# Patient Record
Sex: Female | Born: 1965 | Hispanic: No | Marital: Married | Smoking: Former smoker
Health system: Southern US, Community
[De-identification: ages and names within clinical notes are randomized; demographics above are authoritative.]

## PROBLEM LIST (undated history)

## (undated) DIAGNOSIS — J9819 Other pulmonary collapse: Secondary | ICD-10-CM

## (undated) DIAGNOSIS — S62109A Fracture of unspecified carpal bone, unspecified wrist, initial encounter for closed fracture: Secondary | ICD-10-CM

## (undated) DIAGNOSIS — S42009A Fracture of unspecified part of unspecified clavicle, initial encounter for closed fracture: Secondary | ICD-10-CM

## (undated) HISTORY — PX: EYE SURGERY: SHX253

## (undated) HISTORY — PX: TUBAL LIGATION: SHX77

## (undated) HISTORY — PX: ABDOMINAL HYSTERECTOMY: SHX81

## (undated) HISTORY — PX: KNEE ARTHROCENTESIS: SUR44

---

## 2007-05-15 ENCOUNTER — Ambulatory Visit (HOSPITAL_BASED_OUTPATIENT_CLINIC_OR_DEPARTMENT_OTHER): Admission: RE | Admit: 2007-05-15 | Discharge: 2007-05-15 | Payer: Self-pay | Admitting: Ophthalmology

## 2011-01-31 NOTE — Op Note (Signed)
NAMEKAILEIA, FLOW              ACCOUNT NO.:  [id number]   MEDICAL RECORD NO.:  [id number]          PATIENT TYPE:  AMB   LOCATION:  [facility]                         FACILITY:  [facility]   PHYSICIAN:  Tyrone Apple. Karleen Hampshire, M.D.DATE OF BIRTH:  03/01/1966   DATE OF PROCEDURE:  05/15/2007  DATE OF DISCHARGE:                               OPERATIVE REPORT   PREOPERATIVE DIAGNOSIS:  Consecutive exotropia with right hypertropia.   POSTOPERATIVE DIAGNOSIS:  Status post repair of strabismus.   PROCEDURE:  Right medial rectus resection of 6 mm, right lateral rectus  recession of 8 mm, right superior oblique tuck of 5 mm, left lateral  rectus recession of 8 mm on adjustable suture.   SURGEON:  Tyrone Apple. Karleen Hampshire, M.D.   ANESTHESIA:  General with laryngeal airway.   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  This procedure is indicated to restore alignment of the  visual axis and restore single and binocular vision.  The risks and  benefits of the procedure were explained to the patient prior to the  procedure and informed consent was obtained.   DESCRIPTION OF PROCEDURE:  The patient was taken into the operating room  and placed in a supine position.  The entire face was prepped and draped  in the usual sterile manner.  After the induction of general anesthesia  and establishment of laryngeal airway, my attention was first directed  to the right eye.  A lid speculum was placed.  Forced duction tests were  performed and found to be negative.  The globe was then held in the  superotemporal quadrant, the eye was depressed and adducted.  An  incision was made through the superotemporal fornix and taken down to  the posterior subtenon's space.  The right superior oblique tendon was  then isolated on two Stephens hooks.  It was subsequently dissected free  from its overlying muscle  fascia and intramuscular septum and it was  held on a Greene hook and a mark was placed on the tendon at the 5 mm.  The tendon was then imbricated with 6-0 Vicryl suture at the preplaced  mark and a 5 mm tendon tuck was then performed.  The sutures were tied  securely and the conjunctiva was repositioned.   My attention was then directed to the left eye and a lid speculum was  placed.  Forced duction tests were performed and found to be negative.  The globe was held in the inferotemporal quadrant, the eye was elevated  and adducted, an incision was made through the inferior temporal fornix  and taken down to the posterior subtenon's space.  The left lateral  rectus tendon was then isolated on a Stephens hook and subsequently on  the [employer].  The lateral rectus tendon had been previously operated  and was recessed approximately 3 mm from its native insertion.  It was  then imbricated on 6-0 Vicryl suture, detached from the globe, and  recessed an additional 5 mm completing an 8 mm recession.  The tendon  was then reattached to the globe with the preplaced sutures and the  sutures were tied securely.   My attention was then directed to the right lateral rectus recession  where an identical right rectus recession of 8 mm was performed using  the technique outlined above, with the exception that the sutures were  placed in ajustable suture fashion .The tendon was previously recessed  and it was necessary to go through scar tissue in all of the resection.   Next, my attention was directed to the right medial rectus tendon.  The  globe was held in the inferonasal quadrant, the eye was elevated and  abducted, an incision was made through the inferior nasal fornix and  taken down to the posterior subtenon's space.  The right medial rectus  tendon was then isolated on a Stephens hook and subsequently on the  [employer].  It was carefully dissected free from its overlying muscle  fascia  and recessed for a distance of approximately 8 mm, a mark was  then placed on the tendon at 6 mm from its insertion, and the tendon had  been previously recessed at approximately 4 mm.  The tendon was then  imbricated on a 6-0 Vicryl suture, taking two locking bites at the  medial and temporal apices.  It was then advanced to its native  insertion on the preplaced sutures after resection of 6 mm.  The sutures  were tied securely.   The conjunctiva was repositioned.  At the conclusion of the procedure,  TobraDex ointment was instilled in the fornices of the left eye and the  right lateral rectus recession which was placed on adjustable suture,  was placed underneath a double pressure patch for later adjustment.  The  patient was subsequently adjusted in the operating suite one hour post  the initial procedure.  There were no apparent complications for either  procedure.      Casimiro Needle A. Karleen Hampshire, M.D.  Electronically Signed     MAS/MEDQ  D:  05/15/2007  T:  05/15/2007  Job:  [id number]

## 2011-06-30 LAB — POCT HEMOGLOBIN-HEMACUE: Operator id: 268271

## 2015-04-07 ENCOUNTER — Encounter (HOSPITAL_COMMUNITY): Payer: Self-pay | Admitting: Emergency Medicine

## 2015-04-07 ENCOUNTER — Emergency Department (HOSPITAL_COMMUNITY)
Admission: EM | Admit: 2015-04-07 | Discharge: 2015-04-07 | Disposition: A | Discharge status: Home / Self Care | Payer: Self-pay | Attending: Emergency Medicine | Admitting: Emergency Medicine

## 2015-04-07 DIAGNOSIS — Z87891 Personal history of nicotine dependence: Secondary | ICD-10-CM | POA: Insufficient documentation

## 2015-04-07 DIAGNOSIS — Z79899 Other long term (current) drug therapy: Secondary | ICD-10-CM | POA: Insufficient documentation

## 2015-04-07 DIAGNOSIS — M25512 Pain in left shoulder: Secondary | ICD-10-CM | POA: Insufficient documentation

## 2015-04-07 DIAGNOSIS — M542 Cervicalgia: Secondary | ICD-10-CM | POA: Insufficient documentation

## 2015-04-07 DIAGNOSIS — A691 Other Vincent's infections: Secondary | ICD-10-CM | POA: Insufficient documentation

## 2015-04-07 DIAGNOSIS — M549 Dorsalgia, unspecified: Secondary | ICD-10-CM | POA: Insufficient documentation

## 2015-04-07 MED ORDER — CLINDAMYCIN HCL 150 MG PO CAPS: 450.0000 mg | ORAL_CAPSULE | Freq: Three times a day (TID) | ORAL | Status: AC

## 2015-04-07 MED ORDER — CLINDAMYCIN HCL 150 MG PO CAPS
450.0000 mg | ORAL_CAPSULE | Freq: Once | ORAL | Status: AC
  Administered 2015-04-07: 450 mg via ORAL
  Filled 2015-04-07: qty 3

## 2015-04-07 NOTE — ED Notes (Signed)
Pt sister pt's face is has been swelling over the last 4 days.
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
## 2015-04-07 NOTE — Discharge Instructions (Signed)
Gingivitis  Gingivitis is an infection of the teeth and bones that support the teeth. Your gums become red, sore, and puffy (swollen). It is caused by germs that build up on your teeth and gums (plaque). HOME CARE  Floss and then brush your teeth.  Brush at least twice a day.  Floss at least once a day.  Avoid sugar between meals.  Do not drink juice before bed. Only drink water.  Make and keep your regular checkups and cleanings with your dentist.  Use any mouth care product or toothpaste as told by your dentist. GET HELP RIGHT AWAY IF:  You have painful, red tissue around your teeth.  You have trouble chewing.  You have loose or infected teeth. MAKE SURE YOU:  Understand these instructions.  Will watch your condition.  Will get help right away if you are not doing well or get worse. Document Released: 10/07/2010 Document Revised: 11/27/2011 Document Reviewed: 12/09/2010 [facility] Patient Information 2015 [employer] [place]. This information is not intended to replace advice given to you by your health care provider. Make sure you discuss any questions you have with your health care provider.  Take anabolic as directed. The best to follow-up with a dentist sometime in the next few days. Return for any new or worse symptoms.

## 2015-07-07 ENCOUNTER — Ambulatory Visit (HOSPITAL_COMMUNITY): Payer: Self-pay | Admitting: Specialist

## 2015-07-13 ENCOUNTER — Ambulatory Visit (HOSPITAL_COMMUNITY): Payer: Self-pay | Attending: General Practice

## 2015-07-13 ENCOUNTER — Encounter (HOSPITAL_COMMUNITY): Payer: Self-pay

## 2015-07-13 DIAGNOSIS — M25532 Pain in left wrist: Secondary | ICD-10-CM | POA: Insufficient documentation

## 2015-07-13 DIAGNOSIS — M6281 Muscle weakness (generalized): Secondary | ICD-10-CM | POA: Insufficient documentation

## 2015-07-13 DIAGNOSIS — S52502S Unspecified fracture of the lower end of left radius, sequela: Secondary | ICD-10-CM | POA: Insufficient documentation

## 2015-07-13 DIAGNOSIS — R29898 Other symptoms and signs involving the musculoskeletal system: Secondary | ICD-10-CM

## 2015-07-13 DIAGNOSIS — M25632 Stiffness of left wrist, not elsewhere classified: Secondary | ICD-10-CM | POA: Insufficient documentation

## 2015-07-13 DIAGNOSIS — X58XXXS Exposure to other specified factors, sequela: Secondary | ICD-10-CM | POA: Insufficient documentation

## 2015-07-13 NOTE — Patient Instructions (Signed)
Complete each stretch 2-3X a day. Hold for 10 seconds. Repeat 3 times.   WRIST SUPINATION STRETCH  Grasp your wrist as shown and gently turn your affected wrist towards palm face up.   Keep your elbow bent and by the side of your  body.    WRIST FLEXOR STRETCH  Use your unaffected hand to bend the affected wrist up as shown.   Keep the elbow straight on the affected side the entire time.     WRIST EXTENSOR STRETCH  Use your unaffected hand to bend the affected wrist down as shown.   Keep the elbow straight on the affected side the entire time.    Home Exercises Program Theraputty Exercises  Do the following exercises 2-3 times a day using your affected hand. Spend about 15-30 minutes 1. Roll putty into a ball.  2. Make into a pancake.  3. Roll putty into a roll.  4. Pinch along log with first finger and thumb.   5. Make into a ball.  6. Roll it back into a log.   7. Pinch using thumb and side of first finger.  8. Roll into a ball, then flatten into a pancake.  9. Using your fingers, make putty into a mountain.
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
## 2015-07-21 ENCOUNTER — Telehealth (HOSPITAL_COMMUNITY): Payer: Self-pay | Admitting: Specialist

## 2015-07-21 ENCOUNTER — Ambulatory Visit (HOSPITAL_COMMUNITY): Payer: Self-pay | Admitting: Specialist

## 2015-07-21 NOTE — Telephone Encounter (Signed)
She was in pain and she forgot b/c she took a pain pill

## 2015-07-22 ENCOUNTER — Ambulatory Visit (HOSPITAL_COMMUNITY): Payer: Self-pay | Attending: General Practice | Admitting: Occupational Therapy

## 2015-07-22 ENCOUNTER — Encounter (HOSPITAL_COMMUNITY): Payer: Self-pay | Admitting: Occupational Therapy

## 2015-07-22 DIAGNOSIS — M25532 Pain in left wrist: Secondary | ICD-10-CM | POA: Insufficient documentation

## 2015-07-22 DIAGNOSIS — M6281 Muscle weakness (generalized): Secondary | ICD-10-CM | POA: Insufficient documentation

## 2015-07-22 DIAGNOSIS — M25632 Stiffness of left wrist, not elsewhere classified: Secondary | ICD-10-CM | POA: Insufficient documentation

## 2015-07-22 NOTE — Patient Instructions (Signed)
Edema Control (Control of Swelling)- General Guidelines for the Hand  1) Keep affected hand elevated above heart level as much as possible. 2) Perform retrograde massage- apply hand lotion to elevated hand & massage the lotion from the finger tips down to the wrist. massage down only, for about 5 minutes, 3-5x per day 3) Raise hand over head & open/close fist 20 x every hour. 4) Apply ice 10-15 minutes 3x per day (Or as needed)

## 2015-07-22 NOTE — Therapy (Signed)
[facility] [address] [place], [place], [address] Phone: [phone]   Fax:  [phone]  Occupational Therapy Treatment  Patient Details  Name: Penny ClarkSharon Cervantes MRN: [id number] Date of Birth: 04/19/1966 Referring Provider: Forest BeckerJason Halvorson  Encounter Date: 07/22/2015      OT End of Session - 07/22/15 1617    Visit Number 2   Number of Visits 12   Date for OT Re-Evaluation 09/11/15  Mini reassess: 08/10/15   Authorization Type Self Pay    OT Start Time 1516   OT Stop Time 1556   OT Time Calculation (min) 40 min   Activity Tolerance Patient tolerated treatment well   Behavior During Therapy [facility] for tasks assessed/performed      History reviewed. No pertinent past medical history.  Past Surgical History  Procedure Laterality Date  . Knee arthrocentesis    . Cesarean section    . Eye surgery    . Tubal ligation    . Abdominal hysterectomy      There were no vitals filed for this visit.  Visit Diagnosis:  Pain, wrist joint, left  Stiffness of wrist joint, left      Subjective Assessment - 07/22/15 1518    Subjective  S: It just started swelling today.    Currently in Pain? Yes   Pain Score 8    Pain Location Wrist   Pain Orientation Left   Pain Descriptors / Indicators Aching;Sore;Sharp   Pain Type Acute pain            [facility] OT Assessment - 07/22/15 1607    Assessment   Diagnosis Left distal radius fracture   Precautions   Precautions Other (comment)   Precaution Comments Work on wrist ROM as able to tolerate.                  OT Treatments/Exercises (OP) - 07/22/15 1608    Exercises   Exercises Wrist;Hand   Wrist Exercises   Forearm Supination PROM;10 reps;Self ROM;5 reps   Forearm Pronation PROM;10 reps;Self ROM;5 reps   Wrist Flexion PROM;10 reps;Self ROM;5 reps   Wrist Extension PROM;10 reps;Self ROM;5 reps   Wrist Radial Deviation PROM;10 reps   Wrist Ulnar Deviation PROM;10 reps   Other wrist exercises Reviewed HEP-wrist extension stretch, wrist flexion stretch, 3 reps 10 seconds each   Manual Therapy   Manual Therapy Edema management;Myofascial release   Edema Management Retrograde massage to left hand and distal wrist to decrease edema and pain and increase joint range of motion.   Myofascial Release Myofascial release to left dorsal and volar wrist and forearm regions to decrease pain and fascial restrictions and increase joint range of motion.                 OT Education - 07/22/15 1617    Education provided Yes   Education Details edema management strategies   Person(s) Educated Patient;Spouse   Methods Explanation;Demonstration;Handout   Comprehension Verbalized understanding;Returned demonstration          OT Short Term Goals - 07/22/15 1627    OT SHORT TERM GOAL #1   Title Patient will be educated and independent with HEP.   Time 3   Period Weeks   Status On-going   OT SHORT TERM GOAL #2   Title Patient will report a decrease in pain to 3/10 or less when completing daily tasks.   Time 3   Period Weeks   Status On-going   OT  SHORT TERM GOAL #3   Title Patient will increase A/ROM of wrist by 5 degrees to increase ability to fix hair.   Time 3   Period Weeks   Status On-going   OT SHORT TERM GOAL #4   Title Patient will increase grip strength by 10# and pinch strength by 4# to increase ability to hold onto items with dropping.   Time 3   Period Weeks   Status On-going   OT SHORT TERM GOAL #5   Title Patient will decrease fascial restrictions to trace amount to increase ability to manipulate buttons and zippers on clothing.    Time 3   Period Weeks   Status On-going           OT Long Term Goals - 07/22/15 1627    OT LONG TERM GOAL #1   Title Patient will return to highest level of independence with all daily tasks using LUE as dominant extremity.    Time 6   Period Weeks   Status On-going   OT LONG TERM GOAL #2   Title  Patient will increase A/ROM of left wrist and forearm to [facility] to increase ability to complete self care tasks.    Time 6   Period Weeks   Status On-going   OT LONG TERM GOAL #3   Title Patient will decrease pain level to 1/10 or less in order to return to handwriting tasks.   Time 6   Period Weeks   Status On-going   OT LONG TERM GOAL #4   Title Patient will increase grip strength by 15# and pinch strength by 6# to increase ability to hold onto items without dropping.    Time 6   Period Weeks   Status On-going   OT LONG TERM GOAL #5   Title Patient will increase left wrist and forearm trength to 4/5 to increase ability to return to driving tasks.     Time 6   Period Weeks   Status On-going               Plan - 07/22/15 1618    Clinical Impression Statement A: Pt called before appt to report increased swelling and discomfort. Pt arrived for appt with spouse, minimal edema noted along dorsal hand, lateral wrist area; pt reports she has been using it a lot at home. Initiated myofascial release, retrograde massage, P/ROM, and self-ROM wrist and forearm exercises. Provided pt with edema management strategies HEP.     Plan P: Continue working on achieving P/ROM WFL, follow up on edema management strategies. Attempt A/ROM exercises if able to tolerate        Problem List There are no active problems to display for this patient.   Ezra Sites, OTR/L  [phone]  07/22/2015, 4:28 PM  [facility] [address] [place], [place], [address] Phone: [phone]   Fax:  [phone]  Name: Penny Cervantes MRN: [id number] Date of Birth: 1966-04-17

## 2015-07-26 ENCOUNTER — Ambulatory Visit (HOSPITAL_COMMUNITY): Payer: Self-pay

## 2015-07-27 ENCOUNTER — Encounter (HOSPITAL_COMMUNITY): Payer: Self-pay | Admitting: Occupational Therapy

## 2015-07-27 ENCOUNTER — Ambulatory Visit (HOSPITAL_COMMUNITY): Payer: Self-pay | Admitting: Occupational Therapy

## 2015-07-27 DIAGNOSIS — M25532 Pain in left wrist: Secondary | ICD-10-CM

## 2015-07-27 DIAGNOSIS — M25632 Stiffness of left wrist, not elsewhere classified: Secondary | ICD-10-CM

## 2015-07-27 DIAGNOSIS — R29898 Other symptoms and signs involving the musculoskeletal system: Secondary | ICD-10-CM

## 2015-07-27 NOTE — Therapy (Signed)
[facility] [address] [place], [place], [address] Phone: [phone]   Fax:  [phone]  Occupational Therapy Treatment  Patient Details  Name: Penny Cervantes MRN: [id number] Date of Birth: [birth year] Referring Provider: Forest Becker  Encounter Date: 07/27/2015      OT End of Session - 07/27/15 1433    Visit Number 3   Number of Visits 12   Date for OT Re-Evaluation 09/11/15  Mini reassess: 08/10/15   Authorization Type Self Pay    OT Start Time 1347   OT Stop Time 1430   OT Time Calculation (min) 43 min   Activity Tolerance Patient tolerated treatment well   Behavior During Therapy [facility] for tasks assessed/performed      History reviewed. No pertinent past medical history.  Past Surgical History  Procedure Laterality Date  . Knee arthrocentesis    . Cesarean section    . Eye surgery    . Tubal ligation    . Abdominal hysterectomy      There were no vitals filed for this visit.  Visit Diagnosis:  Pain, wrist joint, left  Stiffness of wrist joint, left  Decreased grip strength of left hand      Subjective Assessment - 07/27/15 1347    Subjective  S: I fell on my arm yesterday and today.    Currently in Pain? Yes   Pain Score 8    Pain Location Shoulder   Pain Orientation Left   Pain Descriptors / Indicators Aching;Sore   Pain Type Acute pain            [facility] OT Assessment - 07/27/15 1347    Assessment   Diagnosis Left distal radius fracture   Precautions   Precautions Other (comment)   Precaution Comments Work on wrist ROM as able to tolerate.                  OT Treatments/Exercises (OP) - 07/27/15 1414    Exercises   Exercises Wrist;Hand   Wrist Exercises   Forearm Supination PROM;AROM;10 reps   Forearm Pronation PROM;AROM;10 reps   Wrist Flexion PROM;AROM;10 reps   Wrist Extension PROM;AROM;10 reps   Wrist Radial Deviation PROM;AROM;10 reps   Wrist Ulnar Deviation PROM;AROM;10  reps   Additional Wrist Exercises   Sponges 33 regular; 12 high resistance   Theraputty Flatten;Roll;Grip;Pinch   Theraputty - Flatten yellow   Theraputty - Roll yellow   Theraputty - Grip yellow-supinated and pronated    Theraputty - Pinch yellow-lateral and 2 pt   Manual Therapy   Manual Therapy Edema management;Myofascial release   Edema Management Retrograde massage to left hand and distal wrist to decrease edema and pain and increase joint range of motion.   Myofascial Release Myofascial release to left dorsal and volar wrist and forearm regions to decrease pain and fascial restrictions and increase joint range of motion.                   OT Short Term Goals - 07/22/15 1627    OT SHORT TERM GOAL #1   Title Patient will be educated and independent with HEP.   Time 3   Period Weeks   Status On-going   OT SHORT TERM GOAL #2   Title Patient will report a decrease in pain to 3/10 or less when completing daily tasks.   Time 3   Period Weeks   Status On-going   OT SHORT TERM GOAL #3   Title  Patient will increase A/ROM of wrist by 5 degrees to increase ability to fix hair.   Time 3   Period Weeks   Status On-going   OT SHORT TERM GOAL #4   Title Patient will increase grip strength by 10# and pinch strength by 4# to increase ability to hold onto items with dropping.   Time 3   Period Weeks   Status On-going   OT SHORT TERM GOAL #5   Title Patient will decrease fascial restrictions to trace amount to increase ability to manipulate buttons and zippers on clothing.    Time 3   Period Weeks   Status On-going           OT Long Term Goals - 07/22/15 1627    OT LONG TERM GOAL #1   Title Patient will return to highest level of independence with all daily tasks using LUE as dominant extremity.    Time 6   Period Weeks   Status On-going   OT LONG TERM GOAL #2   Title Patient will increase A/ROM of left wrist and forearm to [facility] to increase ability to complete self  care tasks.    Time 6   Period Weeks   Status On-going   OT LONG TERM GOAL #3   Title Patient will decrease pain level to 1/10 or less in order to return to handwriting tasks.   Time 6   Period Weeks   Status On-going   OT LONG TERM GOAL #4   Title Patient will increase grip strength by 15# and pinch strength by 6# to increase ability to hold onto items without dropping.    Time 6   Period Weeks   Status On-going   OT LONG TERM GOAL #5   Title Patient will increase left wrist and forearm trength to 4/5 to increase ability to return to driving tasks.     Time 6   Period Weeks   Status On-going               Plan - 07/27/15 1434    Clinical Impression Statement A: Pt presents with minimal edema along lateral wrist this session. Pt report she fell yesterday and today and caught herself on her hand/wrist, increased pain due to fall. Added A/ROM exercises, yellow theraputty, and sponges this session. Reviewed HEP with pt and husband.    Plan P: Continue working on achieving P/ROM [facility]. Continue P/ROM, A/ROM exercises, follow up on pain and swelling at home.         Problem List There are no active problems to display for this patient.   Ezra SitesLeslie Troxler, OTR/L  [phone]  07/27/2015, 2:40 PM   [facility] [address] [place], [place], [address] Phone: [phone]   Fax:  [phone]  Name: Penny Cervantes MRN: [id number] Date of Birth: 11/07/1965

## 2015-08-04 ENCOUNTER — Ambulatory Visit (HOSPITAL_COMMUNITY): Payer: Self-pay

## 2015-08-04 DIAGNOSIS — R29898 Other symptoms and signs involving the musculoskeletal system: Secondary | ICD-10-CM

## 2015-08-04 DIAGNOSIS — M25632 Stiffness of left wrist, not elsewhere classified: Secondary | ICD-10-CM

## 2015-08-04 DIAGNOSIS — M25532 Pain in left wrist: Secondary | ICD-10-CM

## 2015-08-04 NOTE — Patient Instructions (Addendum)
Use 1lb. Hand weight or can of soup or water bottle. Complete 12-15 reps. 2-3X a day.  Wrist Extension  Supporting the arm with a table top and palm facing downward, raise the weight as far as possible by bending the wrist back.  Maintain contact with the table throughout the movement.     FREE WEIGHT RADIAL DEVIATION - TABLE  Hold a small free weight, rest your forearm on a table and bend your wrist up and down with your palm facing towards the side as shown.     Wrist Supination  Supporting forearm on a table and with palm facing down, rotate the wrist to palm up position.  Maintain forearm contact with the table.

## 2015-08-04 NOTE — Therapy (Signed)
[facility] [address] [place], [place], [address] Phone: [phone]   Fax:  [phone]  Occupational Therapy Treatment  Patient Details  Name: Penny Cervantes MRN: [id number] Date of Birth: 1966-06-05 Referring Provider: Forest Becker  Encounter Date: 08/04/2015      OT End of Session - 08/04/15 1620    Visit Number 4   Number of Visits 12   Date for OT Re-Evaluation 09/11/15  Mini reassess: 08/10/15   Authorization Type Self Pay    OT Start Time 1520   OT Stop Time 1600   OT Time Calculation (min) 40 min   Activity Tolerance Patient tolerated treatment well   Behavior During Therapy [facility] for tasks assessed/performed      No past medical history on file.  Past Surgical History  Procedure Laterality Date  . Knee arthrocentesis    . Cesarean section    . Eye surgery    . Tubal ligation    . Abdominal hysterectomy      There were no vitals filed for this visit.  Visit Diagnosis:  Stiffness of wrist joint, left  Pain, wrist joint, left  Decreased grip strength of left hand      Subjective Assessment - 08/04/15 1617    Subjective  S: My nephew messed up my hand. I was changing his diaper and he was all over the place. He landed right on my wrist. It hurt really bad.    Patient is accompained by: Family member   Currently in Pain? Yes   Pain Score 1    Pain Location Wrist   Pain Orientation Left   Pain Descriptors / Indicators Sore   Pain Type Acute pain            [facility] OT Assessment - 08/04/15 1618    Assessment   Diagnosis Left distal radius fracture   Precautions   Precautions Other (comment)   Precaution Comments Work on wrist ROM as able to tolerate.   Restrictions   Weight Bearing Restrictions Yes   LUE Weight Bearing Non weight bearing   Other Position/Activity Restrictions NWB for 4 weeks (08/10/15)                  OT Treatments/Exercises (OP) - 08/04/15 0001    Exercises   Exercises Wrist;Hand   Weighted Stretch Over Towel Roll   Wrist Flexion - Weighted Stretch 1 pound;60 seconds   Wrist Extension - Weighted Stretch 1 pound;60 seconds   Wrist Exercises   Forearm Supination PROM;10 reps   Forearm Pronation PROM;10 reps   Wrist Flexion PROM;10 reps  Completed with elbow on table and gravity assisting.    Wrist Extension AROM;10 reps   Wrist Radial Deviation AROM;10 reps   Wrist Ulnar Deviation AROM;10 reps   Additional Wrist Exercises   Hand Gripper with Large Beads 6/6 beads with gripper set at 7# and 11#   Hand Gripper with Medium Beads 13/13 beads with gripper set at 7# and 11#   Hand Gripper with Small Beads 17/17 beads with gripper set at 7# and 11#   Manual Therapy   Manual Therapy Myofascial release   Manual therapy comments manual therapy completed prior to therapy exercises   Myofascial Release Myofascial release to left dorsal and volar wrist and forearm regions to decrease pain and fascial restrictions and increase joint range of motion.                 OT Education -  08/04/15 1616    Education provided Yes   Education Details Wrist strengthening exercises   Person(s) Educated Patient;Spouse   Methods Explanation;Demonstration;Handout   Comprehension Returned demonstration;Verbalized understanding          OT Short Term Goals - 07/22/15 1627    OT SHORT TERM GOAL #1   Title Patient will be educated and independent with HEP.   Time 3   Period Weeks   Status On-going   OT SHORT TERM GOAL #2   Title Patient will report a decrease in pain to 3/10 or less when completing daily tasks.   Time 3   Period Weeks   Status On-going   OT SHORT TERM GOAL #3   Title Patient will increase A/ROM of wrist by 5 degrees to increase ability to fix hair.   Time 3   Period Weeks   Status On-going   OT SHORT TERM GOAL #4   Title Patient will increase grip strength by 10# and pinch strength by 4# to increase ability to hold onto items with  dropping.   Time 3   Period Weeks   Status On-going   OT SHORT TERM GOAL #5   Title Patient will decrease fascial restrictions to trace amount to increase ability to manipulate buttons and zippers on clothing.    Time 3   Period Weeks   Status On-going           OT Long Term Goals - 07/22/15 1627    OT LONG TERM GOAL #1   Title Patient will return to highest level of independence with all daily tasks using LUE as dominant extremity.    Time 6   Period Weeks   Status On-going   OT LONG TERM GOAL #2   Title Patient will increase A/ROM of left wrist and forearm to [facility] to increase ability to complete self care tasks.    Time 6   Period Weeks   Status On-going   OT LONG TERM GOAL #3   Title Patient will decrease pain level to 1/10 or less in order to return to handwriting tasks.   Time 6   Period Weeks   Status On-going   OT LONG TERM GOAL #4   Title Patient will increase grip strength by 15# and pinch strength by 6# to increase ability to hold onto items without dropping.    Time 6   Period Weeks   Status On-going   OT LONG TERM GOAL #5   Title Patient will increase left wrist and forearm trength to 4/5 to increase ability to return to driving tasks.     Time 6   Period Weeks   Status On-going               Plan - 08/04/15 1620    Clinical Impression Statement A: Pt progressed to strengthening exercises this date. Pain reported during wrist flexion. Exercise was modified to be completed with gravity versus against gravity for increased comfort. Min VC for form and technique.   Plan P: Continue to work on strengthening of wrist and grip strengthening. Add fine motor task with tweezers and grooved pegboard.         Problem List There are no active problems to display for this patient.   Penny Cervantes, OTR/L,CBIS  [phone]  08/04/2015, 4:30 PM  [facility] [address] [place], [place], [address] Phone:  [phone]   Fax:  [phone]  Name: Penny ClarkSharon Cervantes MRN: [id number]  Date of Birth: 1965-11-03

## 2015-08-05 ENCOUNTER — Encounter (HOSPITAL_COMMUNITY): Payer: Self-pay | Admitting: Occupational Therapy

## 2015-08-05 ENCOUNTER — Ambulatory Visit (HOSPITAL_COMMUNITY): Payer: Self-pay | Admitting: Occupational Therapy

## 2015-08-05 DIAGNOSIS — R29898 Other symptoms and signs involving the musculoskeletal system: Secondary | ICD-10-CM

## 2015-08-05 DIAGNOSIS — M25532 Pain in left wrist: Secondary | ICD-10-CM

## 2015-08-05 DIAGNOSIS — M25632 Stiffness of left wrist, not elsewhere classified: Secondary | ICD-10-CM

## 2015-08-05 NOTE — Therapy (Signed)
[place] [facility] [address] , [place], [address] Phone: [phone]   Fax:  [phone]  Occupational Therapy Treatment  Patient Details  Name: Penny ClarkSharon Cervantes MRN: [id number] Date of Birth: 04/13/1966 Referring Provider: Forest BeckerJason Halvorson  Encounter Date: 08/05/2015      OT End of Session - 08/05/15 1601    Visit Number 5   Number of Visits 12   Date for OT Re-Evaluation 09/11/15  Mini reassess: 08/10/15   Authorization Type Self Pay    OT Start Time 1430   OT Stop Time 1515   OT Time Calculation (min) 45 min   Activity Tolerance Patient tolerated treatment well   Behavior During Therapy [facility] for tasks assessed/performed      History reviewed. No pertinent past medical history.  Past Surgical History  Procedure Laterality Date  . Knee arthrocentesis    . Cesarean section    . Eye surgery    . Tubal ligation    . Abdominal hysterectomy      There were no vitals filed for this visit.  Visit Diagnosis:  Stiffness of wrist joint, left  Pain, wrist joint, left  Decreased grip strength of left hand      Subjective Assessment - 08/05/15 1426    Subjective  S: My wrist is feeling pretty good, a little pain but not too much.    Currently in Pain? Yes   Pain Score 3    Pain Location Shoulder   Pain Orientation Left   Pain Descriptors / Indicators Sore   Pain Type Acute pain            [facility] OT Assessment - 08/05/15 1426    Assessment   Diagnosis Left distal radius fracture   Precautions   Precautions Other (comment)   Precaution Comments Work on wrist ROM as able to tolerate.                  OT Treatments/Exercises (OP) - 08/05/15 1431    Exercises   Exercises Wrist;Hand   Weighted Stretch Over Towel Roll   Wrist Flexion - Weighted Stretch 1 pound;60 seconds   Wrist Extension - Weighted Stretch 1 pound;60 seconds   Wrist Exercises   Forearm Supination PROM;10 reps   Forearm Pronation PROM;10  reps   Wrist Flexion PROM;AROM;10 reps   Wrist Extension PROM;AROM;10 reps   Wrist Radial Deviation AROM;10 reps   Wrist Ulnar Deviation AROM;10 reps   Additional Wrist Exercises   Theraputty Flatten;Roll;Grip;Pinch   Theraputty - Roll yellow   Theraputty - Grip yellow-supinated and pronated    Theraputty - Pinch yellow-lateral and 2 pt   Hand Gripper with Large Beads 6/6 beads with gripper set at 15#   Hand Gripper with Medium Beads 13/13 beads with gripper set 15#   Hand Gripper with Small Beads 17/17 beads with gripper set 15#   Fine Motor Coordination   Fine Motor Coordination Grooved pegs   Grooved pegs Pt completed grooved pegboard task with tweezers this session. Pt used left hand to grasp and place grooved pegs using tweezers, with mod difficulty turning and manipulating pegs into pegboard. Pt used right hand to position pegs in tweezers intermittantly during task.    Manual Therapy   Manual Therapy Myofascial release   Manual therapy comments manual therapy completed prior to therapy exercises   Myofascial Release Myofascial release to left dorsal and volar wrist and forearm regions to decrease pain and fascial restrictions and increase joint range  of motion.                 OT Education - 08/04/15 1616    Education provided Yes   Education Details Wrist strengthening exercises   Person(s) Educated Patient;Spouse   Methods Explanation;Demonstration;Handout   Comprehension Returned demonstration;Verbalized understanding          OT Short Term Goals - 07/22/15 1627    OT SHORT TERM GOAL #1   Title Patient will be educated and independent with HEP.   Time 3   Period Weeks   Status On-going   OT SHORT TERM GOAL #2   Title Patient will report a decrease in pain to 3/10 or less when completing daily tasks.   Time 3   Period Weeks   Status On-going   OT SHORT TERM GOAL #3   Title Patient will increase A/ROM of wrist by 5 degrees to increase ability to fix hair.    Time 3   Period Weeks   Status On-going   OT SHORT TERM GOAL #4   Title Patient will increase grip strength by 10# and pinch strength by 4# to increase ability to hold onto items with dropping.   Time 3   Period Weeks   Status On-going   OT SHORT TERM GOAL #5   Title Patient will decrease fascial restrictions to trace amount to increase ability to manipulate buttons and zippers on clothing.    Time 3   Period Weeks   Status On-going           OT Long Term Goals - 07/22/15 1627    OT LONG TERM GOAL #1   Title Patient will return to highest level of independence with all daily tasks using LUE as dominant extremity.    Time 6   Period Weeks   Status On-going   OT LONG TERM GOAL #2   Title Patient will increase A/ROM of left wrist and forearm to [facility] to increase ability to complete self care tasks.    Time 6   Period Weeks   Status On-going   OT LONG TERM GOAL #3   Title Patient will decrease pain level to 1/10 or less in order to return to handwriting tasks.   Time 6   Period Weeks   Status On-going   OT LONG TERM GOAL #4   Title Patient will increase grip strength by 15# and pinch strength by 6# to increase ability to hold onto items without dropping.    Time 6   Period Weeks   Status On-going   OT LONG TERM GOAL #5   Title Patient will increase left wrist and forearm trength to 4/5 to increase ability to return to driving tasks.     Time 6   Period Weeks   Status On-going               Plan - 08/05/15 1601    Clinical Impression Statement A: Added fine motor task using grooved pegboard today, increased hand gripper to 15#. Pt with minimal difficulty during hand gripper exercise, when questioned reports it is slightly challenging. Pt required extra time for tweezer pegboard task due to difficulty manipulating tweezers to place pegs in holes.    Plan P: Increase hand gripper to 18# or 22# as pt is able to tolerate.         Problem List There are no  active problems to display for this patient.   Ezra Sites, OTR/L  [phone]  08/05/2015,  4:05 PM  [facility] [address] [place], [place], [address] Phone: [phone]   Fax:  [phone]  Name: Penny Cervantes MRN: [id number] Date of Birth: 10/05/1965

## 2015-08-07 ENCOUNTER — Encounter (HOSPITAL_COMMUNITY): Payer: Self-pay | Admitting: Emergency Medicine

## 2015-08-07 ENCOUNTER — Emergency Department (HOSPITAL_COMMUNITY): Payer: Self-pay

## 2015-08-07 ENCOUNTER — Emergency Department (HOSPITAL_COMMUNITY)
Admission: EM | Admit: 2015-08-07 | Discharge: 2015-08-07 | Disposition: A | Discharge status: Home / Self Care | Payer: Self-pay | Attending: Emergency Medicine | Admitting: Emergency Medicine

## 2015-08-07 DIAGNOSIS — S40012A Contusion of left shoulder, initial encounter: Secondary | ICD-10-CM | POA: Insufficient documentation

## 2015-08-07 DIAGNOSIS — Z87891 Personal history of nicotine dependence: Secondary | ICD-10-CM | POA: Insufficient documentation

## 2015-08-07 DIAGNOSIS — S6992XA Unspecified injury of left wrist, hand and finger(s), initial encounter: Secondary | ICD-10-CM | POA: Insufficient documentation

## 2015-08-07 DIAGNOSIS — Y9289 Other specified places as the place of occurrence of the external cause: Secondary | ICD-10-CM | POA: Insufficient documentation

## 2015-08-07 DIAGNOSIS — S40021A Contusion of right upper arm, initial encounter: Secondary | ICD-10-CM | POA: Insufficient documentation

## 2015-08-07 DIAGNOSIS — S40022A Contusion of left upper arm, initial encounter: Secondary | ICD-10-CM

## 2015-08-07 DIAGNOSIS — Z79899 Other long term (current) drug therapy: Secondary | ICD-10-CM | POA: Insufficient documentation

## 2015-08-07 DIAGNOSIS — Z880 Allergy status to penicillin: Secondary | ICD-10-CM | POA: Insufficient documentation

## 2015-08-07 DIAGNOSIS — Y9389 Activity, other specified: Secondary | ICD-10-CM | POA: Insufficient documentation

## 2015-08-07 DIAGNOSIS — Z8709 Personal history of other diseases of the respiratory system: Secondary | ICD-10-CM | POA: Insufficient documentation

## 2015-08-07 DIAGNOSIS — Y998 Other external cause status: Secondary | ICD-10-CM | POA: Insufficient documentation

## 2015-08-07 DIAGNOSIS — Z8781 Personal history of (healed) traumatic fracture: Secondary | ICD-10-CM | POA: Insufficient documentation

## 2015-08-07 DIAGNOSIS — W01198A Fall on same level from slipping, tripping and stumbling with subsequent striking against other object, initial encounter: Secondary | ICD-10-CM | POA: Insufficient documentation

## 2015-08-07 DIAGNOSIS — S59912A Unspecified injury of left forearm, initial encounter: Secondary | ICD-10-CM | POA: Insufficient documentation

## 2015-08-07 HISTORY — DX: Other pulmonary collapse: J98.19

## 2015-08-07 HISTORY — DX: Fracture of unspecified carpal bone, unspecified wrist, initial encounter for closed fracture: S62.109A

## 2015-08-07 HISTORY — DX: Fracture of unspecified part of unspecified clavicle, initial encounter for closed fracture: S42.009A

## 2015-08-07 MED ORDER — HYDROCODONE-ACETAMINOPHEN 5-325 MG PO TABS
1.0000 | ORAL_TABLET | Freq: Once | ORAL | Status: AC
  Administered 2015-08-07: 1 via ORAL
  Filled 2015-08-07: qty 1

## 2015-08-07 MED ORDER — CYCLOBENZAPRINE HCL 10 MG PO TABS: 10.0000 mg | ORAL_TABLET | Freq: Two times a day (BID) | ORAL | Status: AC | PRN
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
## 2015-08-07 NOTE — Discharge Instructions (Signed)

## 2015-08-07 NOTE — ED Notes (Signed)
Pt c/o of pain to left shoulder after falling. Pt able to move extremity. No deformity noted.

## 2015-08-09 ENCOUNTER — Encounter (HOSPITAL_COMMUNITY): Payer: Self-pay

## 2015-08-09 ENCOUNTER — Ambulatory Visit (HOSPITAL_COMMUNITY): Payer: Self-pay

## 2015-08-09 DIAGNOSIS — R29898 Other symptoms and signs involving the musculoskeletal system: Secondary | ICD-10-CM

## 2015-08-09 DIAGNOSIS — M25632 Stiffness of left wrist, not elsewhere classified: Secondary | ICD-10-CM

## 2015-08-09 NOTE — Therapy (Signed)
[facility] [address] [place], [place], [address] Phone: [phone]   Fax:  [phone]  Occupational Therapy Treatment  Patient Details  Name: Penny Cervantes MRN: [id number] Date of Birth: 08-17-1966 Referring Provider: Forest Becker  Encounter Date: 08/09/2015      OT End of Session - 08/09/15 1613    Visit Number 6   Number of Visits 12   Date for OT Re-Evaluation 09/11/15  Mini reassess: 08/10/15   Authorization Type Self Pay    OT Start Time 1515   OT Stop Time 1600   OT Time Calculation (min) 45 min   Activity Tolerance Patient tolerated treatment well   Behavior During Therapy [facility] for tasks assessed/performed      Past Medical History  Diagnosis Date  . Collar bone fracture   . Broken wrist   . Collapsed lung     Past Surgical History  Procedure Laterality Date  . Knee arthrocentesis    . Cesarean section    . Eye surgery    . Tubal ligation    . Abdominal hysterectomy      There were no vitals filed for this visit.  Visit Diagnosis:  Stiffness of wrist joint, left  Decreased grip strength of left hand      Subjective Assessment - 08/09/15 1552    Subjective  S: I feel over my dog and I landed on my shoulder.    Currently in Pain? No/denies            [facility] OT Assessment - 08/09/15 1536    Assessment   Diagnosis Left distal radius fracture   Precautions   Precautions Other (comment)   Precaution Comments Work on wrist ROM as able to tolerate.   Restrictions   Weight Bearing Restrictions No                  OT Treatments/Exercises (OP) - 08/09/15 1554    Exercises   Exercises Wrist;Hand   Wrist Exercises   Forearm Supination PROM;10 reps   Forearm Pronation PROM;10 reps   Wrist Flexion PROM;AROM;10 reps   Wrist Extension PROM;AROM;10 reps   Wrist Radial Deviation AROM;PROM;10 reps   Wrist Ulnar Deviation AROM;PROM;10 reps   Other wrist exercises Wrist stretch completed  on table top and against wall; 10" hold. 3X   Other wrist exercises Prayer stretch on table top; 10" hold 3X   Additional Wrist Exercises   Hand Gripper with Large Beads 6/6 beads with gripper set at 18#   Hand Gripper with Medium Beads 13/13 beads with gripper set 18#   Hand Gripper with Small Beads 17/17 beads with gripper set 18#   Manual Therapy   Manual Therapy Myofascial release   Manual therapy comments manual therapy completed prior to therapy exercises   Myofascial Release Myofascial release to left dorsal and volar wrist and forearm regions to decrease pain and fascial restrictions and increase joint range of motion.                 OT Education - 08/09/15 1616    Education provided Yes   Education Details Wrist stretches   Person(s) Educated Patient;Spouse   Methods Explanation;Demonstration;Handout   Comprehension Returned demonstration;Verbalized understanding          OT Short Term Goals - 07/22/15 1627    OT SHORT TERM GOAL #1   Title Patient will be educated and independent with HEP.   Time 3   Period Weeks  Status On-going   OT SHORT TERM GOAL #2   Title Patient will report a decrease in pain to 3/10 or less when completing daily tasks.   Time 3   Period Weeks   Status On-going   OT SHORT TERM GOAL #3   Title Patient will increase A/ROM of wrist by 5 degrees to increase ability to fix hair.   Time 3   Period Weeks   Status On-going   OT SHORT TERM GOAL #4   Title Patient will increase grip strength by 10# and pinch strength by 4# to increase ability to hold onto items with dropping.   Time 3   Period Weeks   Status On-going   OT SHORT TERM GOAL #5   Title Patient will decrease fascial restrictions to trace amount to increase ability to manipulate buttons and zippers on clothing.    Time 3   Period Weeks   Status On-going           OT Long Term Goals - 07/22/15 1627    OT LONG TERM GOAL #1   Title Patient will return to highest level  of independence with all daily tasks using LUE as dominant extremity.    Time 6   Period Weeks   Status On-going   OT LONG TERM GOAL #2   Title Patient will increase A/ROM of left wrist and forearm to [facility] to increase ability to complete self care tasks.    Time 6   Period Weeks   Status On-going   OT LONG TERM GOAL #3   Title Patient will decrease pain level to 1/10 or less in order to return to handwriting tasks.   Time 6   Period Weeks   Status On-going   OT LONG TERM GOAL #4   Title Patient will increase grip strength by 15# and pinch strength by 6# to increase ability to hold onto items without dropping.    Time 6   Period Weeks   Status On-going   OT LONG TERM GOAL #5   Title Patient will increase left wrist and forearm trength to 4/5 to increase ability to return to driving tasks.     Time 6   Period Weeks   Status On-going               Plan - 08/09/15 1613    Clinical Impression Statement A: Increased handgripper to 18# and was able to complete task with increased time and mod difficulty. Added additional wrist stretches this session as patient is able to weightbear.    Plan P: Complete quadraped stretch. Mini reassess.        Problem List There are no active problems to display for this patient.   Limmie PatriciaLaura Kelten Enochs, OTR/L,CBIS  [phone]  08/09/2015, 4:16 PM  [facility] [address] [place], [place], [address] Phone: [phone]   Fax:  [phone]  Name: Krystal ClarkSharon Jacquin MRN: [id number] Date of Birth: 06/23/1966

## 2015-08-09 NOTE — Patient Instructions (Signed)
WRIST EXTENSION STRETCH - TABLE  Place boths hand on a table as shown and gently lean forward until a stretch is felt.  Hold for 10 seconds. Repeat 3 times.      prom wrist extension/prayer stretch  rest your elbows on a table with your hands together. make sure during the exercise to keep your palms together at all times.  now slowly slide your elbows out until you feel a good stretch.  hold for 30 seconds, repeat 5 times, 3 times a day      Wrist Extensor Stretch  Gently bring wrist into flexion on wall, extend elbow straight while keeping the shoulder down.  Hold for 10 seconds. Repeat 3 times.

## 2015-08-10 ENCOUNTER — Ambulatory Visit (HOSPITAL_COMMUNITY): Payer: Self-pay

## 2015-08-10 ENCOUNTER — Encounter (HOSPITAL_COMMUNITY): Payer: Self-pay

## 2015-08-10 DIAGNOSIS — M25532 Pain in left wrist: Secondary | ICD-10-CM

## 2015-08-10 DIAGNOSIS — M25632 Stiffness of left wrist, not elsewhere classified: Secondary | ICD-10-CM

## 2015-08-10 DIAGNOSIS — R29898 Other symptoms and signs involving the musculoskeletal system: Secondary | ICD-10-CM

## 2015-08-10 NOTE — Therapy (Signed)
[place], [place], [address] Phone: [phone]   Fax:  [phone]  Occupational Therapy Treatment and Reassessment  Patient Details  Name: Penny Cervantes MRN: [id number] Date of Birth: 1966/01/21 Referring Provider: Val Eagle  Encounter Date: 08/10/2015      OT End of Session - 08/10/15 1657    Visit Number 7   Number of Visits 12   Date for OT Re-Evaluation 09/11/15  Mini reassess: 08/10/15   Authorization Type Self Pay    OT Start Time 1600   OT Stop Time 1645   OT Time Calculation (min) 45 min   Activity Tolerance Patient tolerated treatment well   Behavior During Therapy [facility] for tasks assessed/performed      Past Medical History  Diagnosis Date  . Collar bone fracture   . Broken wrist   . Collapsed lung     Past Surgical History  Procedure Laterality Date  . Knee arthrocentesis    . Cesarean section    . Eye surgery    . Tubal ligation    . Abdominal hysterectomy      There were no vitals filed for this visit.  Visit Diagnosis:  Stiffness of wrist joint, left  Decreased grip strength of left hand  Pain, wrist joint, left      Subjective Assessment - 08/10/15 1635    Subjective  S: I was doing a lot of writing and my wrist is sore now.    Currently in Pain? Yes   Pain Score 5    Pain Location Wrist   Pain Orientation Left   Pain Descriptors / Indicators Sore   Pain Type Acute pain            [facility] OT Assessment - 08/10/15 1607    Assessment   Diagnosis Left distal radius fracture   Precautions   Precautions Other (comment)   Precaution Comments Work on wrist ROM as able to tolerate.   Coordination   9 Hole Peg Test Left   Left 9 Hole Peg Test 20.5"  previous: 22.5"   Edema   Edema No edema noted in medial volar aspect of left forearm.   AROM   Overall AROM Comments Assessed seated.    AROM Assessment Site Forearm;Wrist   Left Forearm Supination 90 Degrees   previous: 74   Right/Left Wrist Left   Left Wrist Extension 32 Degrees  previous: 30   Left Wrist Flexion 62 Degrees  previous: 56   Left Wrist Radial Deviation 20 Degrees  previous: 12   Left Wrist Ulnar Deviation 30 Degrees  previous: 26   Strength   Overall Strength Comments Assessed seated.   Strength Assessment Site Forearm;Wrist   Right/Left Forearm Left   Left Forearm Pronation 4/5   Left Forearm Supination 4/5   Right/Left Wrist Left   Left Wrist Flexion 3/5   Left Wrist Extension 3+/5   Left Wrist Radial Deviation 3/5   Left Wrist Ulnar Deviation 3/5   Left Hand Grip (lbs) 30  previous: 14   Left Hand Lateral Pinch 13 lbs  previous: 11   Left Hand 3 Point Pinch 13 lbs  previous: 4                  OT Treatments/Exercises (OP) - 08/10/15 1628    Exercises   Exercises Wrist;Hand   Wrist Exercises   Wrist Flexion AROM;10 reps   Wrist Extension AROM;10 reps   Wrist Radial Deviation  AROM;10 reps   Wrist Ulnar Deviation AROM;10 reps   Additional Wrist Exercises   Theraputty --  wringing of washcloth motion (wrist flexion/extension) red   Theraputty - Flatten red   Theraputty - Roll red   Theraputty - Grip red - pronated and supinated   Theraputty - Pinch red - lateral and 3 point   Neurological Re-education Exercises   Grasp and Release Theraputty                OT Education - 08/09/15 1616    Education provided Yes   Education Details Wrist stretches   Person(s) Educated Patient;Spouse   Methods Explanation;Demonstration;Handout   Comprehension Returned demonstration;Verbalized understanding          OT Short Term Goals - 08/10/15 1618    OT SHORT TERM GOAL #1   Title Patient will be educated and independent with HEP.   Time 3   Period Weeks   Status On-going   OT SHORT TERM GOAL #2   Title Patient will report a decrease in pain to 3/10 or less when completing daily tasks.   Time 3   Period Weeks   Status On-going   OT  SHORT TERM GOAL #3   Title Patient will increase A/ROM of wrist by 5 degrees to increase ability to fix hair.   Time 3   Period Weeks   Status Achieved   OT SHORT TERM GOAL #4   Title Patient will increase grip strength by 10# and pinch strength by 4# to increase ability to hold onto items with dropping.   Time 3   Period Weeks   Status Partially Met   OT SHORT TERM GOAL #5   Title Patient will decrease fascial restrictions to trace amount to increase ability to manipulate buttons and zippers on clothing.    Time 3   Period Weeks   Status Achieved           OT Long Term Goals - 08/10/15 1621    OT LONG TERM GOAL #1   Title Patient will return to highest level of independence with all daily tasks using LUE as dominant extremity.    Time 6   Period Weeks   Status On-going   OT LONG TERM GOAL #2   Title Patient will increase A/ROM of left wrist and forearm to WFL to increase ability to complete self care tasks.    Time 6   Period Weeks   Status On-going   OT LONG TERM GOAL #3   Title Patient will decrease pain level to 1/10 or less in order to return to handwriting tasks.   Time 6   Period Weeks   Status On-going   OT LONG TERM GOAL #4   Title Patient will increase grip strength by 15# and pinch strength by 6# to increase ability to hold onto items without dropping.    Time 6   Period Weeks   Status On-going   OT LONG TERM GOAL #5   Title Patient will increase left wrist and forearm strength to 4/5 to increase ability to return to driving tasks.     Time 6   Period Weeks   Status On-going               Plan - 08/10/15 1700    Clinical Impression Statement A: Mini reassessment completed this date. patient has met 2.5/5 STGs and is progressing towards therapy goals. Patient has made improvements with strength and A/ROM measurements. Recommend continueing   therapy 2X/week for 2 more weeks to continue working on strengthening and ROM.   Plan P: Complete quadraped  stretch.         Problem List There are no active problems to display for this patient.   Laura Essenmacher, OTR/L,CBIS  [phone]  08/10/2015, 5:06 PM  [facility] [address] [place], [place], [address] Phone: [phone]   Fax:  [phone]  Name: Iveliz Suddreth MRN: [id number] Date of Birth: 11/15/1965   

## 2015-08-18 ENCOUNTER — Ambulatory Visit (HOSPITAL_COMMUNITY): Payer: Self-pay

## 2015-08-18 ENCOUNTER — Encounter (HOSPITAL_COMMUNITY): Payer: Self-pay

## 2015-08-18 DIAGNOSIS — M25632 Stiffness of left wrist, not elsewhere classified: Secondary | ICD-10-CM

## 2015-08-18 DIAGNOSIS — R29898 Other symptoms and signs involving the musculoskeletal system: Secondary | ICD-10-CM

## 2015-08-18 DIAGNOSIS — M25532 Pain in left wrist: Secondary | ICD-10-CM

## 2015-08-18 NOTE — Therapy (Signed)
[place], [place], [address] Phone: [phone]   Fax:  [phone]  Occupational Therapy Treatment  Patient Details  Name: Penny Cervantes MRN: [id number] Date of Birth: 07/09/1966 Referring Provider: Val Eagle  Encounter Date: 08/18/2015      OT End of Session - 08/18/15 1612    Visit Number 8   Number of Visits 12   Date for OT Re-Evaluation 09/11/15  Mini reassess: 08/10/15   Authorization Type Self Pay    OT Start Time 1520   OT Stop Time 1600   OT Time Calculation (min) 40 min   Activity Tolerance Patient tolerated treatment well   Behavior During Therapy [facility] for tasks assessed/performed      Past Medical History  Diagnosis Date  . Collar bone fracture   . Broken wrist   . Collapsed lung     Past Surgical History  Procedure Laterality Date  . Knee arthrocentesis    . Cesarean section    . Eye surgery    . Tubal ligation    . Abdominal hysterectomy      There were no vitals filed for this visit.  Visit Diagnosis:  Stiffness of wrist joint, left  Decreased grip strength of left hand  Pain, wrist joint, left      Subjective Assessment - 08/18/15 1542    Subjective  S: I picked a penny off the floor the other day. I couldn't do that before.    Currently in Pain? Yes   Pain Score 4    Pain Location Wrist   Pain Orientation Left   Pain Descriptors / Indicators Sore   Pain Type Acute pain            [facility] OT Assessment - 08/18/15 1545    Assessment   Diagnosis Left distal radius fracture   Precautions   Precautions Other (comment)   Precaution Comments Work on wrist ROM as able to tolerate.                  OT Treatments/Exercises (OP) - 08/18/15 1545    Exercises   Exercises Wrist;Hand   Wrist Exercises   Forearm Supination PROM;10 reps;Strengthening  6X   Bar Weights/Barbell (Forearm Supination) 2 lbs   Forearm Pronation PROM;10 reps;Strengthening   6X   Bar Weights/Barbell (Forearm Pronation) 2 lbs   Wrist Flexion PROM;AROM;10 reps;Strengthening  12X   Bar Weights/Barbell (Wrist Flexion) 2 lbs   Wrist Extension PROM;AROM;10 reps  12X   Bar Weights/Barbell (Wrist Extension) 2 lbs   Wrist Radial Deviation PROM;AROM;10 reps;Strengthening  12X   Bar Weights/Barbell (Radial Deviation) 2 lbs   Wrist Ulnar Deviation PROM;AROM;10 reps;Strengthening  12X   Bar Weights/Barbell (Ulnar Deviation) 2 lbs   Other wrist exercises Wrist flexion and extension weighted bar; 2.5lbs. @x  up/down   Additional Wrist Exercises   Hand Gripper with Large Beads 6/6 beads with gripper set at 20#   Hand Gripper with Medium Beads 13/13 beads with gripper set 20#. Picked up 13/13 beads using fingertip pinch with gripper set at 11#   Hand Gripper with Small Beads 17/17 beads with gripper set 20#   Manual Therapy   Manual Therapy Myofascial release   Manual therapy comments manual therapy completed prior to therapy exercises   Myofascial Release Myofascial release to left dorsal and volar wrist and forearm regions to decrease pain and fascial restrictions and increase joint range of motion.  OT Short Term Goals - 08/18/15 1614    OT SHORT TERM GOAL #1   Title Patient will be educated and independent with HEP.   Time 3   Period Weeks   Status On-going   OT SHORT TERM GOAL #2   Title Patient will report a decrease in pain to 3/10 or less when completing daily tasks.   Time 3   Period Weeks   Status On-going   OT SHORT TERM GOAL #3   Title Patient will increase A/ROM of wrist by 5 degrees to increase ability to fix hair.   Time 3   Period Weeks   OT SHORT TERM GOAL #4   Title Patient will increase grip strength by 10# and pinch strength by 4# to increase ability to hold onto items with dropping.   Time 3   Period Weeks   Status Partially Met   OT SHORT TERM GOAL #5   Title Patient will decrease fascial restrictions to  trace amount to increase ability to manipulate buttons and zippers on clothing.    Time 3   Period Weeks           OT Long Term Goals - 08/10/15 1621    OT LONG TERM GOAL #1   Title Patient will return to highest level of independence with all daily tasks using LUE as dominant extremity.    Time 6   Period Weeks   Status On-going   OT LONG TERM GOAL #2   Title Patient will increase A/ROM of left wrist and forearm to [facility] to increase ability to complete self care tasks.    Time 6   Period Weeks   Status On-going   OT LONG TERM GOAL #3   Title Patient will decrease pain level to 1/10 or less in order to return to handwriting tasks.   Time 6   Period Weeks   Status On-going   OT LONG TERM GOAL #4   Title Patient will increase grip strength by 15# and pinch strength by 6# to increase ability to hold onto items without dropping.    Time 6   Period Weeks   Status On-going   OT LONG TERM GOAL #5   Title Patient will increase left wrist and forearm strength to 4/5 to increase ability to return to driving tasks.     Time 6   Period Weeks   Status On-going               Plan - 08/18/15 1613    Clinical Impression Statement A: progressed to 2# handweight. patient had difficulty completing pronation and supination with reports of increased pain after 6 repeitions. VC for form and technique.    Plan P: Focus on lateral pinch strengthening. Complete supination and pronation with 1#.        Problem List There are no active problems to display for this patient.   Ailene Ravel, OTR/L,CBIS  [phone]  08/18/2015, 4:18 PM  [place] [address] [place], [place], [address] Phone: [phone]   Fax:  [phone]  Name: Penny Cervantes MRN: [id number] Date of Birth: 04/17/1966

## 2015-08-19 ENCOUNTER — Telehealth (HOSPITAL_COMMUNITY): Payer: Self-pay

## 2015-08-19 ENCOUNTER — Ambulatory Visit (HOSPITAL_COMMUNITY): Payer: Self-pay

## 2015-08-19 NOTE — Telephone Encounter (Signed)
She forgot today will reschedule on the end after the 13th. NF

## 2015-08-23 ENCOUNTER — Telehealth (HOSPITAL_COMMUNITY): Payer: Self-pay | Admitting: Specialist

## 2015-08-23 ENCOUNTER — Ambulatory Visit (HOSPITAL_COMMUNITY): Payer: Self-pay | Admitting: Specialist

## 2015-08-23 NOTE — Telephone Encounter (Signed)
Pt is sick and can not come in today or tormorrow. NF

## 2015-08-24 ENCOUNTER — Ambulatory Visit (HOSPITAL_COMMUNITY): Payer: Self-pay

## 2015-08-26 ENCOUNTER — Encounter (HOSPITAL_COMMUNITY): Payer: Self-pay | Admitting: Occupational Therapy

## 2015-08-31 ENCOUNTER — Encounter (HOSPITAL_COMMUNITY): Payer: Self-pay

## 2015-09-09 ENCOUNTER — Encounter (HOSPITAL_COMMUNITY): Payer: Self-pay

## 2015-09-10 ENCOUNTER — Encounter (HOSPITAL_COMMUNITY): Payer: Self-pay | Admitting: Occupational Therapy

## 2015-09-10 ENCOUNTER — Ambulatory Visit (HOSPITAL_COMMUNITY): Payer: Self-pay | Attending: General Practice | Admitting: Occupational Therapy

## 2015-09-10 DIAGNOSIS — M25632 Stiffness of left wrist, not elsewhere classified: Secondary | ICD-10-CM | POA: Insufficient documentation

## 2015-09-10 DIAGNOSIS — M6281 Muscle weakness (generalized): Secondary | ICD-10-CM | POA: Insufficient documentation

## 2015-09-10 DIAGNOSIS — M25532 Pain in left wrist: Secondary | ICD-10-CM | POA: Insufficient documentation

## 2015-09-10 DIAGNOSIS — R29898 Other symptoms and signs involving the musculoskeletal system: Secondary | ICD-10-CM

## 2015-09-10 NOTE — Therapy (Signed)
[place], [place], [address] Phone: [phone]   Fax:  [phone]  Occupational Therapy Reassessment, Treatment, Recertification  Patient Details  Name: Penny Cervantes MRN: [id number] Date of Birth: [birth year] Referring Provider: Val Eagle  Encounter Date: 09/10/2015      OT End of Session - 09/10/15 1545    Visit Number 9   Number of Visits 12   Date for OT Re-Evaluation 11/09/15  mini-reassess 10/08/15   Authorization Type Self Pay    OT Start Time 1430   OT Stop Time 1522   OT Time Calculation (min) 52 min   Activity Tolerance Patient tolerated treatment well   Behavior During Therapy [facility] for tasks assessed/performed      Past Medical History  Diagnosis Date  . Collar bone fracture   . Broken wrist   . Collapsed lung     Past Surgical History  Procedure Laterality Date  . Knee arthrocentesis    . Cesarean section    . Eye surgery    . Tubal ligation    . Abdominal hysterectomy      There were no vitals filed for this visit.  Visit Diagnosis:  Stiffness of wrist joint, left  Decreased grip strength of left hand  Pain, wrist joint, left      Subjective Assessment - 09/10/15 1432    Subjective  S: I think I can lift more now, I just can't hold it for long.    Currently in Pain? No/denies            [facility] OT Assessment - 09/10/15 1513    Assessment   Diagnosis Left distal radius fracture   Precautions   Precautions Other (comment)   Precaution Comments Work on wrist ROM as able to tolerate.   Edema   Edema No edema noted in medial volar aspect of left forearm.   AROM   Overall AROM Comments Assessed seated.    AROM Assessment Site Forearm   Left Forearm Supination 90 Degrees  same as previous    Right/Left Wrist Left   Left Wrist Extension 35 Degrees  previous 32   Left Wrist Flexion 62 Degrees  same as previous   Left Wrist Radial Deviation 20 Degrees  same as  previous   Left Wrist Ulnar Deviation 30 Degrees  same as previous   Strength   Overall Strength Comments Assessed seated.   Strength Assessment Site Forearm   Right/Left Forearm Left   Left Forearm Pronation 4+/5  previous 4/5   Left Forearm Supination 4/5  same as previous   Right/Left Wrist Left   Left Wrist Flexion 4-/5  previous 3/5   Left Wrist Extension 4/5  previous 3+/5   Left Wrist Radial Deviation 4/5  previous 3/5   Left Wrist Ulnar Deviation 4/5  previous 3/5   Left Hand Grip (lbs) 35  previous 30   Left Hand Lateral Pinch 13 lbs  same as previous   Left Hand 3 Point Pinch 10 lbs  previous 13                  OT Treatments/Exercises (OP) - 09/10/15 1433    Exercises   Exercises Wrist;Hand   Weighted Stretch Over Towel Roll   Supination - Weighted Stretch 2 pounds  2 reps of 20"   Wrist Exercises   Forearm Supination PROM;10 reps;Strengthening   Bar Weights/Barbell (Forearm Supination) 2 lbs   Forearm Pronation PROM;10 reps;Strengthening  Bar Weights/Barbell (Forearm Pronation) 2 lbs   Wrist Flexion PROM;AROM;10 reps;Strengthening   Bar Weights/Barbell (Wrist Flexion) 2 lbs   Wrist Extension PROM;AROM;10 reps   Bar Weights/Barbell (Wrist Extension) 2 lbs   Wrist Radial Deviation PROM;AROM;10 reps;Strengthening   Bar Weights/Barbell (Radial Deviation) 2 lbs   Wrist Ulnar Deviation PROM;AROM;10 reps;Strengthening   Bar Weights/Barbell (Ulnar Deviation) 2 lbs   Additional Wrist Exercises   Hand Gripper with Large Beads 6/6 beads with gripper set at 22#   Hand Gripper with Medium Beads 10/10 beads with gripper set 22#.    Hand Gripper with Small Beads 12/12 beads with gripper set 22#   Fine Motor Coordination   Fine Motor Coordination Manipulating coins;Nuts and Bolts   Manipulating coins Pt translated pennies from palm of hand to fingers and placed in slotted container with minimal difficulty.    Nuts and Bolts Pt placed and removed 1 nut  this session, no difficulty   Other Fine Motor Exercises Pt used green resistive clothespin to place 25 high resistance sponges into container using lateral pinch. Pt had min difficulty, does report fatigue at end of task.    Manual Therapy   Manual Therapy Myofascial release   Manual therapy comments manual therapy completed prior to therapy exercises   Myofascial Release Myofascial release to left dorsal and volar wrist and forearm regions to decrease pain and fascial restrictions and increase joint range of motion.                   OT Short Term Goals - 09/10/15 1518    OT SHORT TERM GOAL #1   Title Patient will be educated and independent with HEP.   Time 3   Period Weeks   Status Achieved   OT SHORT TERM GOAL #2   Title Patient will report a decrease in pain to 3/10 or less when completing daily tasks.   Time 3   Period Weeks   Status Achieved   OT SHORT TERM GOAL #3   Title Patient will increase A/ROM of wrist by 5 degrees to increase ability to fix hair.   Time 3   Period Weeks   OT SHORT TERM GOAL #4   Title Patient will increase grip strength by 10# and pinch strength by 4# to increase ability to hold onto items with dropping.   Time 3   Period Weeks   Status Achieved   OT SHORT TERM GOAL #5   Title Patient will decrease fascial restrictions to trace amount to increase ability to manipulate buttons and zippers on clothing.    Time 3   Period Weeks           OT Long Term Goals - 09/10/15 1519    OT LONG TERM GOAL #1   Title Patient will return to highest level of independence with all daily tasks using LUE as dominant extremity.    Time 6   Period Weeks   Status On-going   OT LONG TERM GOAL #2   Title Patient will increase A/ROM of left wrist and forearm to [facility] to increase ability to complete self care tasks.    Time 6   Period Weeks   Status Partially Met   OT LONG TERM GOAL #3   Title Patient will decrease pain level to 1/10 or less in order to  return to handwriting tasks.   Time 6   Period Weeks   Status On-going   OT LONG TERM GOAL #4  Title Patient will increase grip strength by 25# and pinch strength by 10# to increase ability to hold onto items without dropping.    Time 6   Period Weeks   Status Revised   OT LONG TERM GOAL #5   Title Patient will increase left wrist and forearm strength to 4/5 to increase ability to return to driving tasks.     Time 6   Period Weeks   Status Partially Met               Plan - 09/10/15 1547    Clinical Impression Statement A: Reassessment completed this session, although pt only had 1 OT treatment between mini-reassessment and reassessment. Pt has met all STGs, partially met 2/5 LTGs, and 1 LTG has been revised. Recommend continuing therapy 2X/week for 2 more weeks to continue working on wrist, forearm, and grip strengthening and ROM.   Plan P: work on 3 pt pinch strengthening, complete wrist extension stretch.         Problem List There are no active problems to display for this patient.   Guadelupe Sabin, OTR/L  [phone]  09/10/2015, 3:56 PM  [place] [address] [place], [place], [address] Phone: [phone]   Fax:  [phone]  Name: Karri Kallenbach MRN: [id number] Date of Birth: [birth year]

## 2015-09-15 ENCOUNTER — Ambulatory Visit (HOSPITAL_COMMUNITY): Payer: Self-pay

## 2015-09-15 ENCOUNTER — Telehealth (HOSPITAL_COMMUNITY): Payer: Self-pay

## 2015-09-15 NOTE — Telephone Encounter (Signed)
Date: 09/15/15  Called patient regarding missed appt today at 4:00. Patient was unaware that she had an appt and apologized. Patient was reminded of next appt on 09/17/15 and was told to call us if she was unable to make it. Patient verbalized understanding.   [name], OTR/L,CBIS  [phone]

## 2015-09-17 ENCOUNTER — Ambulatory Visit (HOSPITAL_COMMUNITY): Payer: Self-pay

## 2015-09-17 ENCOUNTER — Encounter (HOSPITAL_COMMUNITY): Payer: Self-pay

## 2015-09-17 DIAGNOSIS — M25532 Pain in left wrist: Secondary | ICD-10-CM

## 2015-09-17 DIAGNOSIS — M25632 Stiffness of left wrist, not elsewhere classified: Secondary | ICD-10-CM

## 2015-09-17 DIAGNOSIS — R29898 Other symptoms and signs involving the musculoskeletal system: Secondary | ICD-10-CM

## 2015-09-17 NOTE — Therapy (Signed)
[facility] [address] [place], [place], [address] Phone: [phone]   Fax:  [phone]  Occupational Therapy Treatment  Patient Details  Name: Penny Cervantes MRN: [id number] Date of Birth: 07/14/1966 Referring Provider: Jason Halvorson  Encounter Date: 09/17/2015      OT End of Session - 09/17/15 1631    Visit Number 10   Number of Visits 12   Date for OT Re-Evaluation 11/09/15  mini-reassess 10/08/15   Authorization Type Self Pay    OT Start Time 1515   OT Stop Time 1600   OT Time Calculation (min) 45 min   Activity Tolerance Patient tolerated treatment well   Behavior During Therapy [facility] for tasks assessed/performed      Past Medical History  Diagnosis Date  . Collar bone fracture   . Broken wrist   . Collapsed lung     Past Surgical History  Procedure Laterality Date  . Knee arthrocentesis    . Cesarean section    . Eye surgery    . Tubal ligation    . Abdominal hysterectomy      There were no vitals filed for this visit.  Visit Diagnosis:  Stiffness of wrist joint, left  Decreased grip strength of left hand  Pain, wrist joint, left      Subjective Assessment - 09/17/15 1628    Subjective  S: It's a little tender but it's not bad.    Currently in Pain? Yes   Pain Score 3    Pain Location Wrist   Pain Orientation Left   Pain Descriptors / Indicators Tender   Pain Type Acute pain            [facility] OT Assessment - 09/17/15 1630    Assessment   Diagnosis Left distal radius fracture   Precautions   Precautions Other (comment)   Precaution Comments Work on wrist ROM as able to tolerate.                  OT Treatments/Exercises (OP) - 09/17/15 1536    Exercises   Exercises Wrist;Hand   Wrist Exercises   Forearm Supination Strengthening;15 reps   Bar Weights/Barbell (Forearm Supination) 2 lbs   Forearm Pronation Strengthening;15 reps   Bar Weights/Barbell (Forearm Pronation) 2 lbs   Wrist Flexion PROM;5 reps;Strengthening;15 reps   Bar Weights/Barbell (Wrist Flexion) 2 lbs   Wrist Extension PROM;5 reps;Strengthening;15 reps   Bar Weights/Barbell (Wrist Extension) 2 lbs   Wrist Radial Deviation Strengthening;15 reps   Bar Weights/Barbell (Radial Deviation) 2 lbs   Wrist Ulnar Deviation Strengthening;15 reps   Bar Weights/Barbell (Ulnar Deviation) 2 lbs   Other wrist exercises Wrist flexion and extension weighted bar; 2.5lbs. 5x up/down   Other wrist exercises Patient used pvc pipe to press biscuit shapes into green putty using wrist flexion and extension movement.    Additional Wrist Exercises   Theraputty - Pinch green - 3 point and lateral pinch   Theraputty - Locate Pegs Green - 5/5 beads found   Hand Exercises   Other Hand Exercises Wrist extrension stretch completed on tabletop. 10 seconds; 3 times   Manual Therapy   Manual Therapy Myofascial release   Manual therapy comments manual therapy completed prior to therapy exercises   Myofascial Release Myofascial release to left dorsal and volar wrist and forearm regions to decrease pain and fascial restrictions and increase joint range of motion.                     OT Short Term Goals - 09/17/15 1635    OT SHORT TERM GOAL #1   Title Patient will be educated and independent with HEP.   Time 3   Period Weeks   OT SHORT TERM GOAL #2   Title Patient will report a decrease in pain to 3/10 or less when completing daily tasks.   Time 3   Period Weeks   OT SHORT TERM GOAL #3   Title Patient will increase A/ROM of wrist by 5 degrees to increase ability to fix hair.   Time 3   Period Weeks   OT SHORT TERM GOAL #4   Title Patient will increase grip strength by 10# and pinch strength by 4# to increase ability to hold onto items with dropping.   Time 3   Period Weeks   OT SHORT TERM GOAL #5   Title Patient will decrease fascial restrictions to trace amount to increase ability to manipulate buttons and zippers  on clothing.    Time 3   Period Weeks           OT Long Term Goals - 09/17/15 1635    OT LONG TERM GOAL #1   Title Patient will return to highest level of independence with all daily tasks using LUE as dominant extremity.    Time 6   Period Weeks   Status On-going   OT LONG TERM GOAL #2   Title Patient will increase A/ROM of left wrist and forearm to [facility] to increase ability to complete self care tasks.    Time 6   Period Weeks   Status Partially Met   OT LONG TERM GOAL #3   Title Patient will decrease pain level to 1/10 or less in order to return to handwriting tasks.   Time 6   Period Weeks   Status On-going   OT LONG TERM GOAL #4   Title Patient will increase grip strength by 25# and pinch strength by 10# to increase ability to hold onto items without dropping.    Time 6   Period Weeks   Status On-going   OT LONG TERM GOAL #5   Title Patient will increase left wrist and forearm strength to 4/5 to increase ability to return to driving tasks.     Time 6   Period Weeks   Status Partially Met               Plan - 09/17/15 1632    Clinical Impression Statement A: Session with focus on pinch strengthening and wrist strengthening with min VC for form and technique.    Plan P: Continue to work on grip and pinch strengthening. Increase to 3# for wrist strengthening if able to tolerate.         Problem List There are no active problems to display for this patient.   Ailene Ravel, OTR/L,CBIS  [phone]  09/17/2015, 4:35 PM  [facility] [address] [place], [place], [address] Phone: [phone]   Fax:  [phone]  Name: Rumaisa Schnetzer MRN: [id number] Date of Birth: [birth year]

## 2015-09-22 ENCOUNTER — Ambulatory Visit (HOSPITAL_COMMUNITY): Payer: Self-pay | Attending: General Practice | Admitting: Occupational Therapy

## 2015-09-22 ENCOUNTER — Encounter (HOSPITAL_COMMUNITY): Payer: Self-pay | Admitting: Occupational Therapy

## 2015-09-22 DIAGNOSIS — M6281 Muscle weakness (generalized): Secondary | ICD-10-CM | POA: Insufficient documentation

## 2015-09-22 DIAGNOSIS — M25632 Stiffness of left wrist, not elsewhere classified: Secondary | ICD-10-CM | POA: Insufficient documentation

## 2015-09-22 DIAGNOSIS — R29898 Other symptoms and signs involving the musculoskeletal system: Secondary | ICD-10-CM

## 2015-09-22 DIAGNOSIS — M25532 Pain in left wrist: Secondary | ICD-10-CM | POA: Insufficient documentation

## 2015-09-22 NOTE — Therapy (Signed)
[place], [place], [address] Phone: [phone]   Fax:  [phone]  Occupational Therapy Treatment  Patient Details  Name: Penny Cervantes MRN: [id number] Date of Birth: 04/01/1966 Referring Provider: Val Eagle  Encounter Date: 09/22/2015      OT End of Session - 09/22/15 1447    Visit Number 11   Number of Visits 12   Date for OT Re-Evaluation 11/09/15  mini-reassess 10/08/15   Authorization Type Self Pay    OT Start Time 1300   OT Stop Time 1344   OT Time Calculation (min) 44 min   Activity Tolerance Patient tolerated treatment well   Behavior During Therapy [facility] for tasks assessed/performed      Past Medical History  Diagnosis Date  . Collar bone fracture   . Broken wrist   . Collapsed lung     Past Surgical History  Procedure Laterality Date  . Knee arthrocentesis    . Cesarean section    . Eye surgery    . Tubal ligation    . Abdominal hysterectomy      There were no vitals filed for this visit.  Visit Diagnosis:  Stiffness of wrist joint, left  Decreased grip strength of left hand  Pain, wrist joint, left      Subjective Assessment - 09/22/15 1301    Subjective  S: It just sore in this one area. (medial wrist area)   Currently in Pain? Yes   Pain Score 2    Pain Location Wrist   Pain Orientation Left   Pain Descriptors / Indicators Sore   Pain Type Acute pain            [facility] OT Assessment - 09/22/15 1300    Assessment   Diagnosis Left distal radius fracture   Precautions   Precautions Other (comment)   Precaution Comments Work on wrist ROM as able to tolerate.                  OT Treatments/Exercises (OP) - 09/22/15 1302    Exercises   Exercises Wrist;Hand   Weighted Stretch Over Towel Roll   Supination - Weighted Stretch 2 pounds;30 seconds  2 reps   Wrist Exercises   Forearm Supination Strengthening;15 reps   Bar Weights/Barbell (Forearm  Supination) 3 lbs   Forearm Pronation Strengthening;15 reps   Bar Weights/Barbell (Forearm Pronation) 3 lbs   Wrist Flexion PROM;5 reps;Strengthening;15 reps   Bar Weights/Barbell (Wrist Flexion) 3 lbs   Wrist Extension PROM;5 reps;Strengthening;15 reps   Bar Weights/Barbell (Wrist Extension) --  8 reps with 2#; 7 reps with 3#   Wrist Radial Deviation Strengthening;15 reps   Bar Weights/Barbell (Radial Deviation) 3 lbs   Wrist Ulnar Deviation Strengthening;15 reps   Bar Weights/Barbell (Ulnar Deviation) 3 lbs   Other wrist exercises Wrist flexion and extension weighted bar; 2.5lbs. 5x up/down   Additional Wrist Exercises   Hand Gripper with Large Beads 6/6 beads with gripper set at 25# and 29#   Hand Gripper with Medium Beads 10/10 beads with gripper set 22#.    Hand Gripper with Small Beads 12/12 beads with gripper set 22#   Hand Exercises   Other Hand Exercises Wrist extrension stretch completed on tabletop. 15 seconds; 2 times   Fine Motor Coordination   Other Fine Motor Exercises Pt used green resistive clothespin to place 25 high resistance sponges into container using 3 point pinch. Pt had min difficulty, does report fatigue at  end of task.    Manual Therapy   Manual Therapy Myofascial release   Manual therapy comments manual therapy completed prior to therapy exercises   Myofascial Release Myofascial release to left dorsal and volar wrist and forearm regions to decrease pain and fascial restrictions and increase joint range of motion.                   OT Short Term Goals - 09/17/15 1635    OT SHORT TERM GOAL #1   Title Patient will be educated and independent with HEP.   Time 3   Period Weeks   OT SHORT TERM GOAL #2   Title Patient will report a decrease in pain to 3/10 or less when completing daily tasks.   Time 3   Period Weeks   OT SHORT TERM GOAL #3   Title Patient will increase A/ROM of wrist by 5 degrees to increase ability to fix hair.   Time 3    Period Weeks   OT SHORT TERM GOAL #4   Title Patient will increase grip strength by 10# and pinch strength by 4# to increase ability to hold onto items with dropping.   Time 3   Period Weeks   OT SHORT TERM GOAL #5   Title Patient will decrease fascial restrictions to trace amount to increase ability to manipulate buttons and zippers on clothing.    Time 3   Period Weeks           OT Long Term Goals - 09/17/15 1635    OT LONG TERM GOAL #1   Title Patient will return to highest level of independence with all daily tasks using LUE as dominant extremity.    Time 6   Period Weeks   Status On-going   OT LONG TERM GOAL #2   Title Patient will increase A/ROM of left wrist and forearm to [facility] to increase ability to complete self care tasks.    Time 6   Period Weeks   Status Partially Met   OT LONG TERM GOAL #3   Title Patient will decrease pain level to 1/10 or less in order to return to handwriting tasks.   Time 6   Period Weeks   Status On-going   OT LONG TERM GOAL #4   Title Patient will increase grip strength by 25# and pinch strength by 10# to increase ability to hold onto items without dropping.    Time 6   Period Weeks   Status On-going   OT LONG TERM GOAL #5   Title Patient will increase left wrist and forearm strength to 4/5 to increase ability to return to driving tasks.     Time 6   Period Weeks   Status Partially Met               Plan - 09/22/15 1447    Clinical Impression Statement A: Increased weights to 3# for all wrist strengthening with the exception of wrist extension, reduced to 2# due to pain. Focused on 3 point pinch, increased gripper to 25# and 29#.    Plan P: Follow up with pt on decision regarding continuing therapy or discharging with HEP. Proceed accordingly-reassess & discharge or continues treatments until mini reassessment on 10/08/15.         Problem List There are no active problems to display for this patient.   Guadelupe Sabin,  OTR/L  [phone]  09/22/2015, 2:51 PM  [place]  [address] [place], [place], [address] Phone: [phone]   Fax:  [phone]  Name: Penny Cervantes MRN: [id number] Date of Birth: 1965-12-23

## 2015-09-24 ENCOUNTER — Ambulatory Visit (HOSPITAL_COMMUNITY): Payer: Self-pay | Admitting: Occupational Therapy

## 2015-09-24 ENCOUNTER — Encounter (HOSPITAL_COMMUNITY): Payer: Self-pay | Admitting: Occupational Therapy

## 2015-09-24 DIAGNOSIS — R29898 Other symptoms and signs involving the musculoskeletal system: Secondary | ICD-10-CM

## 2015-09-24 DIAGNOSIS — M25532 Pain in left wrist: Secondary | ICD-10-CM

## 2015-09-24 DIAGNOSIS — M25632 Stiffness of left wrist, not elsewhere classified: Secondary | ICD-10-CM

## 2015-09-24 NOTE — Patient Instructions (Addendum)
Strengthening Exercises  1) WRIST EXTENSION CURLS - TABLE  Hold a small free weight, rest your forearm on a table and bend your wrist up and down with your palm face down as shown.      2) WRIST FLEXION CURLS - TABLE  Hold a small free weight, rest your forearm on a table and bend your wrist up and down with your palm face up as shown.     3) FREE WEIGHT RADIAL/ULNAR DEVIATION - TABLE  Hold a small free weight, rest your forearm on a table and bend your wrist up and down with your palm facing towards the side as shown.     4) Pronation  Forearm supported on table with wrist in neutral position. Using a weight, roll wrist so that palm faces downward. Hold for 2 seconds and return to starting position.     5) Supination  Forearm supported on table with wrist in neutral position. Using a weight, roll wrist so that palm is now facing upward. Hold for 2 seconds and return to starting position.      *Complete exercises using _2-3___ pound weight, __10-5__times each, __1-2__times per day*     Stretches:  1) Prayer Stretch: Play hands together in praying position, slowly lower hands and spread elbows out.    2) Wrist Extension Stretch:    OR

## 2015-09-24 NOTE — Therapy (Signed)
[facility] [address] [place], [place], [address] Phone: [phone]   Fax:  [phone]  Occupational Therapy Treatment and Discharge Summary  Patient Details  Name: Penny Cervantes MRN: [id number] Date of Birth: 14-Jun-1966 Referring Provider: Val Eagle  Encounter Date: 09/24/2015      OT End of Session - 09/24/15 1511    Visit Number 12   Number of Visits 12   Date for OT Re-Evaluation 11/09/15  mini-reassess 10/08/15   Authorization Type Self Pay    OT Start Time 1430   OT Stop Time 1505   OT Time Calculation (min) 35 min   Activity Tolerance Patient tolerated treatment well   Behavior During Therapy [facility] for tasks assessed/performed      Past Medical History  Diagnosis Date  . Collar bone fracture   . Broken wrist   . Collapsed lung     Past Surgical History  Procedure Laterality Date  . Knee arthrocentesis    . Cesarean section    . Eye surgery    . Tubal ligation    . Abdominal hysterectomy      There were no vitals filed for this visit.  Visit Diagnosis:  Stiffness of wrist joint, left  Decreased grip strength of left hand  Pain, wrist joint, left      Subjective Assessment - 09/24/15 1431    Subjective  S: I think I'm going to just get exercises from you guys.    Currently in Pain? Yes   Pain Score 1    Pain Location Wrist   Pain Orientation Left   Pain Descriptors / Indicators Sore   Pain Type Acute pain           [facility] OT Assessment - 09/24/15 1429    Assessment   Diagnosis Left distal radius fracture   Precautions   Precautions Other (comment)   Precaution Comments Work on wrist ROM as able to tolerate.   Coordination   9 Hole Peg Test Left   Left 9 Hole Peg Test 18.79"  previous 20/5"   Edema   Edema No edema noted in medial volar aspect of left forearm.   Palpation   Palpation comment Trace fascial restrictions in left dorsal forearm region   AROM   Overall AROM Comments  Assessed seated.    AROM Assessment Site Forearm   Left Forearm Supination 90 Degrees  WNL; same as previous   Right/Left Wrist Left   Left Wrist Extension 44 Degrees  35 previous   Left Wrist Flexion 60 Degrees  62 previous   Left Wrist Radial Deviation 20 Degrees  same as previous   Left Wrist Ulnar Deviation 30 Degrees  same as previous   Strength   Overall Strength Comments Assessed seated.   Strength Assessment Site Forearm   Right/Left Forearm Left   Left Forearm Pronation 4+/5  same as previous   Left Forearm Supination 4/5  same as previous   Right/Left Wrist Left   Left Wrist Flexion 4/5  4-/5 previous   Left Wrist Extension 4+/5  4/5 previous   Left Wrist Radial Deviation 4+/5  4/5 previous   Left Wrist Ulnar Deviation 4+/5  4/5 previous   Left Hand Grip (lbs) 30  35 previous   Left Hand Lateral Pinch 13 lbs  same as previous   Left Hand 3 Point Pinch 11 lbs  10 previous  OT Treatments/Exercises (OP) - 09/24/15 1449    Exercises   Exercises Wrist;Hand   Wrist Exercises   Forearm Supination Strengthening;15 reps   Bar Weights/Barbell (Forearm Supination) 3 lbs   Forearm Pronation Strengthening;15 reps   Bar Weights/Barbell (Forearm Pronation) 3 lbs   Wrist Flexion Strengthening;15 reps   Bar Weights/Barbell (Wrist Flexion) 3 lbs   Wrist Extension Strengthening;15 reps   Bar Weights/Barbell (Wrist Extension) 3 lbs   Wrist Radial Deviation Strengthening;15 reps   Bar Weights/Barbell (Radial Deviation) 3 lbs   Wrist Ulnar Deviation Strengthening;15 reps   Bar Weights/Barbell (Ulnar Deviation) 3 lbs   Manual Therapy   Manual Therapy Myofascial release   Manual therapy comments manual therapy completed prior to therapy exercises   Myofascial Release Myofascial release to left dorsal and volar wrist and forearm regions to decrease pain and fascial restrictions and increase joint range of motion.                OT  Education - 09/24/15 1455    Education provided Yes   Education Details Wrist strengthening, wrist extension stretches   Person(s) Educated Patient   Methods Explanation;Demonstration;Handout   Comprehension Verbalized understanding;Returned demonstration          OT Short Term Goals - 09/24/15 1512    OT SHORT TERM GOAL #1   Title Patient will be educated and independent with HEP.   Time 3   Period Weeks   OT SHORT TERM GOAL #2   Title Patient will report a decrease in pain to 3/10 or less when completing daily tasks.   Time 3   Period Weeks   OT SHORT TERM GOAL #3   Title Patient will increase A/ROM of wrist by 5 degrees to increase ability to fix hair.   Time 3   Period Weeks   OT SHORT TERM GOAL #4   Title Patient will increase grip strength by 10# and pinch strength by 4# to increase ability to hold onto items with dropping.   Time 3   Period Weeks   OT SHORT TERM GOAL #5   Title Patient will decrease fascial restrictions to trace amount to increase ability to manipulate buttons and zippers on clothing.    Time 3   Period Weeks           OT Long Term Goals - 09/24/15 1512    OT LONG TERM GOAL #1   Title Patient will return to highest level of independence with all daily tasks using LUE as dominant extremity.    Time 6   Period Weeks   Status Achieved   OT LONG TERM GOAL #2   Title Patient will increase A/ROM of left wrist and forearm to [facility] to increase ability to complete self care tasks.    Time 6   Period Weeks   Status Achieved   OT LONG TERM GOAL #3   Title Patient will decrease pain level to 1/10 or less in order to return to handwriting tasks.   Time 6   Period Weeks   Status Not Met   OT LONG TERM GOAL #4   Title Patient will increase grip strength by 25# and pinch strength by 10# to increase ability to hold onto items without dropping.    Time 6   Period Weeks   Status Not Met   OT LONG TERM GOAL #5   Title Patient will increase left wrist and  forearm strength to 4/5 to increase ability to return to  driving tasks.     Time 6   Period Weeks   Status Achieved               Plan - 09/24/15 1513    Clinical Impression Statement A: Pt requests to discharge today due to self-pay status and being pleased with functional level. Reassessment completed, pt has met all STGs and 3/5 LTGs. Pt reports she is able to complete all daily tasks with little to no difficulty, does fatigue when using arm consistently. Provided pt with and educated on HEP including wrist strengthening and wrist extension stretches.    Plan P: Discharge pt        Problem List There are no active problems to display for this patient.  Guadelupe Sabin, OTR/L  [phone]  09/24/2015, 3:16 PM  [facility] [address] [place], [place], [address] Phone: [phone]   Fax:  [phone]  Name: Aarohi Redditt MRN: [id number] Date of Birth: [birth year]   OCCUPATIONAL THERAPY DISCHARGE SUMMARY  Visits from Start of Care: 12  Current functional level related to goals / functional outcomes: See goals above. Pt reports she is able to complete all daily tasks, including cooking, with minimal difficulty and fatigue. Pt reports minimal pain/soreness when using the forearm and hand.    Remaining deficits: Pt continues to have decreased grip and pinch strength in the left hand, although she has improved since evaluation.    Education / Equipment: Pt provided with HEP including wrist strengthening, green theraputty exercises, and wrist extension stretches.  Plan: Patient agrees to discharge.  Patient goals were met. Patient is being discharged due to meeting the stated rehab goals.  ?????
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
